# Patient Record
Sex: Female | Born: 1995 | Race: Black or African American | Hispanic: No | Marital: Single | Smoking: Current every day smoker
Health system: Southern US, Community
[De-identification: ages and names within clinical notes are randomized; demographics above are authoritative.]

## PROBLEM LIST (undated history)

## (undated) DIAGNOSIS — J45909 Unspecified asthma, uncomplicated: Secondary | ICD-10-CM

## (undated) HISTORY — PX: KNEE SURGERY: SHX244

---

## 2021-02-19 ENCOUNTER — Emergency Department (HOSPITAL_COMMUNITY): Payer: Medicaid - Out of State

## 2021-02-19 ENCOUNTER — Encounter (HOSPITAL_COMMUNITY): Payer: Self-pay

## 2021-02-19 ENCOUNTER — Other Ambulatory Visit: Payer: Self-pay

## 2021-02-19 ENCOUNTER — Emergency Department (HOSPITAL_COMMUNITY)
Admission: EM | Admit: 2021-02-19 | Discharge: 2021-02-19 | Disposition: A | Discharge status: Home / Self Care | Payer: Medicaid - Out of State | Attending: Emergency Medicine | Admitting: Emergency Medicine

## 2021-02-19 DIAGNOSIS — M545 Low back pain, unspecified: Secondary | ICD-10-CM | POA: Diagnosis present

## 2021-02-19 DIAGNOSIS — M542 Cervicalgia: Secondary | ICD-10-CM | POA: Insufficient documentation

## 2021-02-19 DIAGNOSIS — M25522 Pain in left elbow: Secondary | ICD-10-CM | POA: Insufficient documentation

## 2021-02-19 DIAGNOSIS — J45909 Unspecified asthma, uncomplicated: Secondary | ICD-10-CM | POA: Insufficient documentation

## 2021-02-19 DIAGNOSIS — Y92410 Unspecified street and highway as the place of occurrence of the external cause: Secondary | ICD-10-CM | POA: Diagnosis not present

## 2021-02-19 DIAGNOSIS — M25562 Pain in left knee: Secondary | ICD-10-CM | POA: Diagnosis not present

## 2021-02-19 DIAGNOSIS — F17210 Nicotine dependence, cigarettes, uncomplicated: Secondary | ICD-10-CM | POA: Insufficient documentation

## 2021-02-19 HISTORY — DX: Unspecified asthma, uncomplicated: J45.909

## 2021-02-19 LAB — POC URINE PREG, ED: Preg Test, Ur: NEGATIVE

## 2021-02-19 MED ORDER — IBUPROFEN 100 MG/5ML PO SUSP
600.0000 mg | Freq: Once | ORAL | Status: AC
  Administered 2021-02-19: 600 mg via ORAL
  Filled 2021-02-19: qty 30

## 2021-02-19 MED ORDER — CYCLOBENZAPRINE HCL 10 MG PO TABS: 10.0000 mg | ORAL_TABLET | Freq: Two times a day (BID) | ORAL | 0 refills | Status: AC | PRN
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
## 2022-05-22 ENCOUNTER — Telehealth: Payer: Self-pay | Admitting: Physician Assistant

## 2022-05-22 DIAGNOSIS — J208 Acute bronchitis due to other specified organisms: Secondary | ICD-10-CM

## 2022-05-22 DIAGNOSIS — B9689 Other specified bacterial agents as the cause of diseases classified elsewhere: Secondary | ICD-10-CM

## 2022-05-22 DIAGNOSIS — J029 Acute pharyngitis, unspecified: Secondary | ICD-10-CM

## 2022-05-22 MED ORDER — PREDNISONE 10 MG (21) PO TBPK: ORAL_TABLET | ORAL | 0 refills | Status: AC

## 2022-05-22 MED ORDER — AMOXICILLIN-POT CLAVULANATE 875-125 MG PO TABS: 1.0000 | ORAL_TABLET | Freq: Two times a day (BID) | ORAL | 0 refills | Status: AC

## 2022-05-22 MED ORDER — BENZONATATE 100 MG PO CAPS: 100.0000 mg | ORAL_CAPSULE | Freq: Three times a day (TID) | ORAL | 0 refills | Status: AC | PRN

## 2022-05-22 NOTE — Progress Notes (Signed)
Virtual Visit Consent   Dawn Pearson, you are scheduled for a virtual visit with a [facility] provider today. Just as with appointments in the office, your consent must be obtained to participate. Your consent will be active for this visit and any virtual visit you may have with one of our providers in the next 365 days. If you have a MyChart account, a copy of this consent can be sent to you electronically.  As this is a virtual visit, video technology does not allow for your provider to perform a traditional examination. This may limit your provider's ability to fully assess your condition. If your provider identifies any concerns that need to be evaluated in person or the need to arrange testing (such as labs, EKG, etc.), we will make arrangements to do so. Although advances in technology are sophisticated, we cannot ensure that it will always work on either your end or our end. If the connection with a video visit is poor, the visit may have to be switched to a telephone visit. With either a video or telephone visit, we are not always able to ensure that we have a secure connection.  By engaging in this virtual visit, you consent to the provision of healthcare and authorize for your insurance to be billed (if applicable) for the services provided during this visit. Depending on your insurance coverage, you may receive a charge related to this service.  I need to obtain your verbal consent now. Are you willing to proceed with your visit today? Pleshette Renice Solarz has provided verbal consent on 05/22/2022 for a virtual visit (video or telephone). Margaretann Loveless, PA-C  Date: 05/22/2022 5:15 PM  Virtual Visit via Video Note   I, Margaretann Loveless, connected with  Clemie Elswick  ([id number], 1995-10-18) on 05/22/22 at  5:00 PM EST by a video-enabled telemedicine application and verified that I am speaking with the correct person using two  identifiers.  Location: Patient: Virtual Visit Location Patient: Home Provider: Virtual Visit Location Provider: Home Office   I discussed the limitations of evaluation and management by telemedicine and the availability of in person appointments. The patient expressed understanding and agreed to proceed.    History of Present Illness: Dawn Pearson is a 26 y.o. who identifies as a female who was assigned female at birth, and is being seen today for sore throat and chest tightness.  HPI: URI  This is a new problem. The current episode started in the past 7 days. The problem has been gradually worsening. Maximum temperature: subjective fevers. Associated symptoms include chest pain (tightness), congestion, coughing, headaches, rhinorrhea, sinus pain and a sore throat. Pertinent negatives include no diarrhea, ear pain, nausea, plugged ear sensation, vomiting or wheezing. Associated symptoms comments: Hoarse voice, chills. Treatments tried: hot tea with lemon. The treatment provided no relief.    Had arthroscopic procedure on knee in 06/2020 and now having a pinch in that knee with certain movements.  Problems: There are no problems to display for this patient.   Allergies: No Known Allergies Medications:  Current Outpatient Medications:    amoxicillin-clavulanate (AUGMENTIN) 875-125 MG tablet, Take 1 tablet by mouth 2 (two) times daily., Disp: 14 tablet, Rfl: 0   benzonatate (TESSALON) 100 MG capsule, Take 1 capsule (100 mg total) by mouth 3 (three) times daily as needed., Disp: 30 capsule, Rfl: 0   predniSONE (STERAPRED UNI-PAK 21 TAB) 10 MG (21) TBPK tablet, 6 day taper; take as directed on package instructions,  Disp: 21 tablet, Rfl: 0   atomoxetine (STRATTERA) 18 MG capsule, Take 18 mg by mouth daily., Disp: , Rfl:    buPROPion (WELLBUTRIN XL) 150 MG 24 hr tablet, Take 150 mg by mouth daily., Disp: , Rfl:    cyclobenzaprine (FLEXERIL) 10 MG tablet, Take 1 tablet (10 mg total)  by mouth 2 (two) times daily as needed for muscle spasms., Disp: 20 tablet, Rfl: 0  Observations/Objective: Patient is well-developed, well-nourished in no acute distress.  Resting comfortably at home.  Head is normocephalic, atraumatic.  No labored breathing.  Speech is clear and coherent with logical content.  Patient is alert and oriented at baseline.  Dry, hacking cough   Assessment and Plan: 1. Acute bacterial bronchitis - amoxicillin-clavulanate (AUGMENTIN) 875-125 MG tablet; Take 1 tablet by mouth 2 (two) times daily.  Dispense: 14 tablet; Refill: 0 - predniSONE (STERAPRED UNI-PAK 21 TAB) 10 MG (21) TBPK tablet; 6 day taper; take as directed on package instructions  Dispense: 21 tablet; Refill: 0 - benzonatate (TESSALON) 100 MG capsule; Take 1 capsule (100 mg total) by mouth 3 (three) times daily as needed.  Dispense: 30 capsule; Refill: 0  2. Sore throat - amoxicillin-clavulanate (AUGMENTIN) 875-125 MG tablet; Take 1 tablet by mouth 2 (two) times daily.  Dispense: 14 tablet; Refill: 0  - Worsening over a week despite OTC medications - Will treat with Z-pack, prednisone and tessalon perles - Prednisone should help knee pain as well.  - Can continue Mucinex  - Push fluids.  - Rest.  - Steam and humidifier can help - Seek in person evaluation if worsening or symptoms fail to improve    Follow Up Instructions: I discussed the assessment and treatment plan with the patient. The patient was provided an opportunity to ask questions and all were answered. The patient agreed with the plan and demonstrated an understanding of the instructions.  A copy of instructions were sent to the patient via MyChart unless otherwise noted below.    The patient was advised to call back or seek an in-person evaluation if the symptoms worsen or if the condition fails to improve as anticipated.  Time:  I spent 15 minutes with the patient via telehealth technology discussing the above  problems/concerns.    Margaretann Loveless, PA-C

## 2022-05-22 NOTE — Patient Instructions (Signed)
Dawn Pearson, thank you for joining Margaretann Loveless, PA-C for today's virtual visit.  While this provider is not your primary care provider (PCP), if your PCP is located in our provider database this encounter information will be shared with them immediately following your visit.   A [facility] MyChart account gives you access to today's visit and all your visits, tests, and labs performed at [facility] " click here if you don't have a [facility] MyChart account or go to [email or web address]  Consent: (Patient) Dawn Pearson provided verbal consent for this virtual visit at the beginning of the encounter.  Current Medications:  Current Outpatient Medications:    amoxicillin-clavulanate (AUGMENTIN) 875-125 MG tablet, Take 1 tablet by mouth 2 (two) times daily., Disp: 14 tablet, Rfl: 0   benzonatate (TESSALON) 100 MG capsule, Take 1 capsule (100 mg total) by mouth 3 (three) times daily as needed., Disp: 30 capsule, Rfl: 0   predniSONE (STERAPRED UNI-PAK 21 TAB) 10 MG (21) TBPK tablet, 6 day taper; take as directed on package instructions, Disp: 21 tablet, Rfl: 0   atomoxetine (STRATTERA) 18 MG capsule, Take 18 mg by mouth daily., Disp: , Rfl:    buPROPion (WELLBUTRIN XL) 150 MG 24 hr tablet, Take 150 mg by mouth daily., Disp: , Rfl:    cyclobenzaprine (FLEXERIL) 10 MG tablet, Take 1 tablet (10 mg total) by mouth 2 (two) times daily as needed for muscle spasms., Disp: 20 tablet, Rfl: 0   Medications ordered in this encounter:  Meds ordered this encounter  Medications   amoxicillin-clavulanate (AUGMENTIN) 875-125 MG tablet    Sig: Take 1 tablet by mouth 2 (two) times daily.    Dispense:  14 tablet    Refill:  0    Order Specific Question:   Supervising Provider    Answer:   Merrilee Jansky [[id number]]   predniSONE (STERAPRED UNI-PAK 21 TAB) 10 MG (21) TBPK tablet    Sig: 6 day taper; take as directed on package instructions    Dispense:   21 tablet    Refill:  0    Order Specific Question:   Supervising Provider    Answer:   Merrilee Jansky [[id number]]   benzonatate (TESSALON) 100 MG capsule    Sig: Take 1 capsule (100 mg total) by mouth 3 (three) times daily as needed.    Dispense:  30 capsule    Refill:  0    Order Specific Question:   Supervising Provider    Answer:   Merrilee Jansky [id number]     *If you need refills on other medications prior to your next appointment, please contact your pharmacy*  Follow-Up: Call back or seek an in-person evaluation if the symptoms worsen or if the condition fails to improve as anticipated.  [facility] [phone]  Other Instructions Acute Bronchitis, Adult  Acute bronchitis is when air tubes in the lungs (bronchi) suddenly get swollen. The condition can make it hard for you to breathe. In adults, acute bronchitis usually goes away within 2 weeks. A cough caused by bronchitis may last up to 3 weeks. Smoking, allergies, and asthma can make the condition worse. What are the causes? Germs that cause cold and flu (viruses). The most common cause of this condition is the virus that causes the common cold. Bacteria. Substances that bother (irritate) the lungs, including: Smoke from cigarettes and other types of tobacco. Dust and pollen. Fumes from chemicals, gases, or burned fuel. Indoor or  outdoor air pollution. What increases the risk? A weak body's defense system. This is also called the immune system. Any condition that affects your lungs and breathing, such as asthma. What are the signs or symptoms? A cough. Coughing up clear, yellow, or green mucus. Making high-pitched whistling sounds when you breathe, most often when you breathe out (wheezing). Runny or stuffy nose. Having too much mucus in your lungs (chest congestion). Shortness of breath. Body aches. A sore throat. How is this treated? Acute bronchitis may go away over time without treatment.  Your doctor may tell you to: Drink more fluids. This will help thin your mucus so it is easier to cough up. Use a device that gets medicine into your lungs (inhaler). Use a vaporizer or a humidifier. These are machines that add water to the air. This helps with coughing and poor breathing. Take a medicine that thins mucus and helps clear it from your lungs. Take a medicine that prevents or stops coughing. It is not common to take an antibiotic medicine for this condition. Follow these instructions at home:  Take over-the-counter and prescription medicines only as told by your doctor. Use an inhaler, vaporizer, or humidifier as told by your doctor. Take two teaspoons (10 mL) of honey at bedtime. This helps lessen your coughing at night. Drink enough fluid to keep your pee (urine) pale yellow. Do not smoke or use any products that contain nicotine or tobacco. If you need help quitting, ask your doctor. Get a lot of rest. Return to your normal activities when your doctor says that it is safe. Keep all follow-up visits. How is this prevented?  Wash your hands often with soap and water for at least 20 seconds. If you cannot use soap and water, use hand sanitizer. Avoid contact with people who have cold symptoms. Try not to touch your mouth, nose, or eyes with your hands. Avoid breathing in smoke or chemical fumes. Make sure to get the flu shot every year. Contact a doctor if: Your symptoms do not get better in 2 weeks. You have trouble coughing up the mucus. Your cough keeps you awake at night. You have a fever. Get help right away if: You cough up blood. You have chest pain. You have very bad shortness of breath. You faint or keep feeling like you are going to faint. You have a very bad headache. Your fever or chills get worse. These symptoms may be an emergency. Get help right away. Call your local emergency services (911 in the [place] Do not wait to see if the symptoms will go  away. Do not drive yourself to the hospital. Summary Acute bronchitis is when air tubes in the lungs (bronchi) suddenly get swollen. In adults, acute bronchitis usually goes away within 2 weeks. Drink more fluids. This will help thin your mucus so it is easier to cough up. Take over-the-counter and prescription medicines only as told by your doctor. Contact a doctor if your symptoms do not improve after 2 weeks of treatment. This information is not intended to replace advice given to you by your health care provider. Make sure you discuss any questions you have with your health care provider. Document Revised: 09/13/2020 Document Reviewed: 09/13/2020 Elsevier Patient Education  2023 Elsevier Inc.    If you have been instructed to have an in-person evaluation today at a local Urgent Care facility, please use the link below. It will take you to a list of all of our available [facility], including address, phone number and hours of operation. Please do not delay care.  [facility]  If you or a family member do not have a primary care provider, use the link below to schedule a visit and establish care. When you choose a [facility] primary care physician or advanced practice provider, you gain a long-term partner in health. Find a Primary Care Provider  Learn more about [facility]'s in-office and virtual care options: [facility]

## 2022-11-15 ENCOUNTER — Other Ambulatory Visit: Payer: Self-pay

## 2022-11-15 ENCOUNTER — Encounter (HOSPITAL_COMMUNITY): Payer: Self-pay

## 2022-11-15 ENCOUNTER — Emergency Department (HOSPITAL_COMMUNITY)
Admission: EM | Admit: 2022-11-15 | Discharge: 2022-11-15 | Discharge status: Against Medical Advice | Payer: Medicaid - Out of State | Attending: Emergency Medicine | Admitting: Emergency Medicine

## 2022-11-15 DIAGNOSIS — J069 Acute upper respiratory infection, unspecified: Secondary | ICD-10-CM | POA: Diagnosis not present

## 2022-11-15 DIAGNOSIS — R059 Cough, unspecified: Secondary | ICD-10-CM | POA: Diagnosis present

## 2022-11-15 DIAGNOSIS — Z5321 Procedure and treatment not carried out due to patient leaving prior to being seen by health care provider: Secondary | ICD-10-CM | POA: Diagnosis not present

## 2022-11-15 NOTE — ED Notes (Signed)
Pt did not answer for bed assignemnt

## 2022-11-15 NOTE — ED Triage Notes (Signed)
Pt arrived POV. C/O congestion, cough, sore throat x 1 day. Pt reports this morning having numbness and pressure from under R eye across cheekbone towards ear. Pt has been tx w/ hot tea, dayquil and nightquil w/o relief.
